# Patient Record
Sex: Female | Born: 1975 | Race: White | Hispanic: No | Marital: Married | Smoking: Former smoker
Health system: Southern US, Community
[De-identification: ages and names within clinical notes are randomized; demographics above are authoritative.]

## PROBLEM LIST (undated history)

## (undated) DIAGNOSIS — E785 Hyperlipidemia, unspecified: Secondary | ICD-10-CM

## (undated) DIAGNOSIS — I2089 Other forms of angina pectoris: Secondary | ICD-10-CM

## (undated) DIAGNOSIS — K219 Gastro-esophageal reflux disease without esophagitis: Secondary | ICD-10-CM

## (undated) DIAGNOSIS — I10 Essential (primary) hypertension: Secondary | ICD-10-CM

## (undated) DIAGNOSIS — F411 Generalized anxiety disorder: Secondary | ICD-10-CM

## (undated) DIAGNOSIS — I208 Other forms of angina pectoris: Secondary | ICD-10-CM

## (undated) HISTORY — DX: Other forms of angina pectoris: I20.89

## (undated) HISTORY — DX: Generalized anxiety disorder: F41.1

## (undated) HISTORY — DX: Hyperlipidemia, unspecified: E78.5

## (undated) HISTORY — PX: CHOLECYSTECTOMY: SHX55

## (undated) HISTORY — DX: Other forms of angina pectoris: I20.8

## (undated) HISTORY — DX: Essential (primary) hypertension: I10

## (undated) HISTORY — PX: HERNIA REPAIR: SHX51

## (undated) HISTORY — DX: Gastro-esophageal reflux disease without esophagitis: K21.9

---

## 2017-03-13 ENCOUNTER — Encounter: Payer: Self-pay | Admitting: *Deleted

## 2017-03-14 ENCOUNTER — Encounter (INDEPENDENT_AMBULATORY_CARE_PROVIDER_SITE_OTHER): Payer: Self-pay

## 2017-03-14 ENCOUNTER — Ambulatory Visit (INDEPENDENT_AMBULATORY_CARE_PROVIDER_SITE_OTHER): Admitting: Cardiovascular Disease

## 2017-03-14 ENCOUNTER — Telehealth: Payer: Self-pay | Admitting: Cardiovascular Disease

## 2017-03-14 ENCOUNTER — Encounter: Payer: Self-pay | Admitting: *Deleted

## 2017-03-14 ENCOUNTER — Encounter: Payer: Self-pay | Admitting: Cardiovascular Disease

## 2017-03-14 VITALS — BP 115/76 | HR 60 | Ht 59.5 in | Wt 161.0 lb

## 2017-03-14 DIAGNOSIS — M542 Cervicalgia: Secondary | ICD-10-CM | POA: Diagnosis not present

## 2017-03-14 DIAGNOSIS — M79602 Pain in left arm: Secondary | ICD-10-CM | POA: Diagnosis not present

## 2017-03-14 DIAGNOSIS — Z87891 Personal history of nicotine dependence: Secondary | ICD-10-CM | POA: Diagnosis not present

## 2017-03-14 DIAGNOSIS — R079 Chest pain, unspecified: Secondary | ICD-10-CM

## 2017-03-14 DIAGNOSIS — R002 Palpitations: Secondary | ICD-10-CM

## 2017-03-14 DIAGNOSIS — R5383 Other fatigue: Secondary | ICD-10-CM | POA: Diagnosis not present

## 2017-03-14 DIAGNOSIS — R0609 Other forms of dyspnea: Secondary | ICD-10-CM | POA: Diagnosis not present

## 2017-03-14 NOTE — Progress Notes (Signed)
CARDIOLOGY CONSULT NOTE  Patient ID: Donna Stewart MRN: [id number] DOB/AGE: [birth year] 39 y.o.  Admit date: (Not on file) Primary Physician: Toma Deiters, MD Referring Physician: Dr. Olena Leatherwood   Reason for Consultation: Chest pain  HPI: Donna Stewart is a 41 y.o. female who is being seen today for the evaluation of chest pain at the request of Hasanaj, Myra Gianotti, MD.   Past medical history includes tobacco use and GERD.  I personally reviewed all relevant documentation, labs, studies.   D-dimer was elevated at 0.9 on 02/21/17. She underwent a normal V/Q scan at [facility] on 02/21/17.  She will underwent a normal exercise treadmill stress test on 02/24/17 at [facility]. She exercised for 7 minutes and 40 seconds achieving a maximum heart rate of 160 bpm. Target heart rate was 154 bpm. There were reportedly no significant ST segment elevations or depressions.  She has felt fatigued for the past year. She said a sleep study approximately 2 years ago was normal. She has significant posterior neck pain exacerbated with rotation. The pain then radiates into the right side of her chest. She also has accompanying left shoulder and arm pain with tingling and numbness of her left hand. The chest pain is located behind the right breast.  She also describes intermittent palpitations which primarily occur when she is lying down at night watching TV in bed.  She has right-sided chest pains with deep breathing. Her mother has psoriatic arthritis. She said she was tested for polymyalgia rheumatica and the blood test was normal. I do not have any blood tests other than the d-dimer at present.  She said her neck pain is always present. She takes Aleve for this. The neck pain will radiate into the right chest and then she has left-sided jaw pain.  She quit smoking in January 2017 but smoked a pack a day for over 20 years.  She describes shortness of breath when climbing stairs in her  house. She has near syncopal episodes. She denies exertional chest pain.  Since January of this year she has lost 35 pounds with dieting.  Social history: She is married. She has 4 sons. She is originally from [place], [place]. She had been living in [place] and moved to [place] about 3/2 years ago. Her mother-in-law is also my patient.  Family history: Father developed heart disease in his late 40s/early 70s. He had an MI, CABG, and recently valve replacement surgery.   Allergies  Allergen Reactions  . Amoxicillin Hives  . Penicillins Hives  . Prednisone Other (See Comments) and Cough    Chest heaviness    Current Outpatient Prescriptions  Medication Sig Dispense Refill  . Cholecalciferol (VITAMIN D3) 1000 units CAPS Take 1 capsule by mouth daily.     Marland Kitchen esomeprazole (NEXIUM) 40 MG capsule Take 40 mg by mouth daily at 12 noon.    . metoprolol succinate (TOPROL-XL) 50 MG 24 hr tablet Take 25 mg by mouth daily. Take with or immediately following a meal.     . Multiple Vitamins-Minerals (HAIR SKIN NAILS PO) Take 3 tablets by mouth daily.    . nitroGLYCERIN (NITROSTAT) 0.4 MG SL tablet Place 0.4 mg under the tongue every 5 (five) minutes as needed for chest pain.    Marland Kitchen norethindrone (MICRONOR,CAMILA,ERRIN) 0.35 MG tablet Take 1 tablet by mouth daily.  3  . Omega-3 Fatty Acids (FISH OIL) 1000 MG CAPS Take 1,000-3,000 mg by mouth daily.     Marland Kitchen  sertraline (ZOLOFT) 25 MG tablet Take 25 mg by mouth daily.    Marland Kitchen topiramate (TOPAMAX) 50 MG tablet Take 50 mg by mouth every other day.     . vitamin C (ASCORBIC ACID) 500 MG tablet Take 500 mg by mouth daily.     No current facility-administered medications for this visit.     Past Medical History:  Diagnosis Date  . Dyslipidemia   . Gastroesophageal reflux disease   . Generalized anxiety disorder   . Hypertension   . Other forms of angina pectoris (HCC)     No past surgical history on file.  Social History   Social  History  . Marital status: Married    Spouse name: N/A  . Number of children: N/A  . Years of education: N/A   Occupational History  . Not on file.   Social History Main Topics  . Smoking status: Former Smoker    Packs/day: 0.50    Years: 24.00    Types: Cigarettes    Start date: 11/18/1991    Quit date: 06/04/2015  . Smokeless tobacco: Never Used  . Alcohol use Not on file  . Drug use: Unknown  . Sexual activity: Not on file   Other Topics Concern  . Not on file   Social History Narrative  . No narrative on file     No family history of premature CAD in 1st degree relatives.  Current Meds  Medication Sig  . Cholecalciferol (VITAMIN D3) 1000 units CAPS Take 1 capsule by mouth daily.   Marland Kitchen esomeprazole (NEXIUM) 40 MG capsule Take 40 mg by mouth daily at 12 noon.  . metoprolol succinate (TOPROL-XL) 50 MG 24 hr tablet Take 25 mg by mouth daily. Take with or immediately following a meal.   . Multiple Vitamins-Minerals (HAIR SKIN NAILS PO) Take 3 tablets by mouth daily.  . nitroGLYCERIN (NITROSTAT) 0.4 MG SL tablet Place 0.4 mg under the tongue every 5 (five) minutes as needed for chest pain.  Marland Kitchen norethindrone (MICRONOR,CAMILA,ERRIN) 0.35 MG tablet Take 1 tablet by mouth daily.  . Omega-3 Fatty Acids (FISH OIL) 1000 MG CAPS Take 1,000-3,000 mg by mouth daily.   . sertraline (ZOLOFT) 25 MG tablet Take 25 mg by mouth daily.  Marland Kitchen topiramate (TOPAMAX) 50 MG tablet Take 50 mg by mouth every other day.   . vitamin C (ASCORBIC ACID) 500 MG tablet Take 500 mg by mouth daily.      Review of systems complete and found to be negative unless listed above in HPI    Physical exam Blood pressure 115/76, pulse 60, height 4' 11.5" (1.511 m), weight 161 lb (73 kg). General: NAD Neck: No JVD, no thyromegaly or thyroid nodule. Posterior neck tenderness. Lungs: Clear to auscultation bilaterally with normal respiratory effort. CV: Nondisplaced PMI. Regular rate and rhythm, normal S1/S2, no S3/S4,  no murmur.  No peripheral edema.  No carotid bruit.    Abdomen: Soft, nontender, no distention.  Skin: Intact without lesions or rashes.  Neurologic: Alert and oriented x 3.  Psych: Normal affect. Extremities: No clubbing or cyanosis.  HEENT: Normal.   ECG: Most recent ECG reviewed.   Labs: No results found for: K, BUN, CREATININE, ALT, TSH, HGB   Lipids: No results found for: LDLCALC, LDLDIRECT, CHOL, TRIG, HDL      ASSESSMENT AND PLAN:  1. Chest pain: It appears to be more pleuritic in quality located on the right side. VQ scan was normal thus excluding pulmonary embolism. It is  not typical for coronary artery disease. She denies exertional symptoms. Stress test was also normal.  2. Posterior neck pain: It is exacerbated with neck rotation and she appears to have a left arm radiculopathy. She may have nerve root impingement. I will obtain an MRI of her cervical spine.  3. Palpitations: I will obtain a one-week event monitor to primarily evaluate for supraventricular arrhythmias.  4. Dyspnea on exertion: She has a history of tobacco abuse. Her mother and son have asthma. I will obtain spirometry to evaluate for obstructive and perhaps restrictive lung disease. I will also obtain copies of blood work from PCP.  5. Fatigue: This has been going on for over a year. Sleep study was reportedly normal. I wonder if she has the beginnings of autoimmune disease given her mother's history of psoriatic arthritis. The patient denies fevers, joint swelling, and rashes. I will obtain copies of blood tests from PCP. I would check an ANA if not already done yet.     Disposition: Follow up in 6 weeks.   Signed: Prentice Docker, M.D., F.A.C.C.  03/14/2017, 9:38 AM

## 2017-03-14 NOTE — Telephone Encounter (Signed)
MRI cervical spine w/&w/o contrast dx: neck and left arm pain Spirometry w/bronchodilation challenge dx: DOE & h/o tobacco abuse  Scheduled at [facility] Oct 18,2018 at 9:00 arrive at 8:45

## 2017-03-14 NOTE — Patient Instructions (Signed)
Medication Instructions:   Your physician recommends that you continue on your current medications as directed. Please refer to the Current Medication list given to you today.  Labwork:  Your physician recommends that you return for lab work to check a BMET prior to your MRI.  Testing/Procedures:  Your physician recommends that you have spirometry testing.  Your physician recommends that you have a MRI. Your physician has recommended that you wear an event monitor for 1 week. Event monitors are medical devices that record the heart's electrical activity. Doctors most often [place] these monitors to diagnose arrhythmias. Arrhythmias are problems with the speed or rhythm of the heartbeat. The monitor is a small, portable device. You can wear one while you do your normal daily activities. This is usually used to diagnose what is causing palpitations/syncope (passing out).   Follow-Up: Your physician recommends that you schedule a follow-up appointment in: 6 weeks.  Any Other Special Instructions Will Be Listed Below (If Applicable).  If you need a refill on your cardiac medications before your next appointment, please call your pharmacy.

## 2017-03-17 ENCOUNTER — Telehealth: Payer: Self-pay | Admitting: Cardiovascular Disease

## 2017-03-17 NOTE — Telephone Encounter (Signed)
Left message for patient to call to schedule   Spirometry w/bronchodilation challenge dx: DOE & h/o tobacco abuse

## 2017-03-18 NOTE — Telephone Encounter (Signed)
Scheduled Spirometry at [facility] for Mar 26, 2017 Arrive at 9:15 instructions given

## 2017-03-19 ENCOUNTER — Ambulatory Visit (INDEPENDENT_AMBULATORY_CARE_PROVIDER_SITE_OTHER)

## 2017-03-19 DIAGNOSIS — R002 Palpitations: Secondary | ICD-10-CM | POA: Diagnosis not present

## 2017-03-20 ENCOUNTER — Ambulatory Visit (HOSPITAL_COMMUNITY)
Admission: RE | Admit: 2017-03-20 | Discharge: 2017-03-20 | Disposition: A | Discharge status: Home / Self Care | Source: Ambulatory Visit | Attending: Cardiovascular Disease | Admitting: Cardiovascular Disease

## 2017-03-20 ENCOUNTER — Telehealth: Payer: Self-pay | Admitting: *Deleted

## 2017-03-20 ENCOUNTER — Encounter (HOSPITAL_COMMUNITY): Payer: Self-pay | Admitting: Radiology

## 2017-03-20 DIAGNOSIS — M542 Cervicalgia: Secondary | ICD-10-CM | POA: Diagnosis present

## 2017-03-20 DIAGNOSIS — M79602 Pain in left arm: Secondary | ICD-10-CM | POA: Insufficient documentation

## 2017-03-20 NOTE — Telephone Encounter (Signed)
MR CERVICAL SPINE WO CONTRAST   Notes recorded by Laqueta LindenKoneswaran, Suresh A, MD on 03/20/2017 at 10:20 AM EDT Normal.   Basic metabolic panel   Notes recorded by Laqueta LindenKoneswaran, Suresh A, MD on 03/19/2017 at 3:43 PM EDT Normal Kidney function, normal potassium.

## 2017-03-20 NOTE — Telephone Encounter (Signed)
Notes recorded by Lesle ChrisHill, Lemon Whitacre G, LPN on 81/19/147810/18/2018 at 11:57 AM EDT Patient notified. Copy to pmd. Follow up scheduled 04/29/2017.

## 2017-03-26 ENCOUNTER — Ambulatory Visit (HOSPITAL_COMMUNITY)
Admission: RE | Admit: 2017-03-26 | Discharge: 2017-03-26 | Disposition: A | Discharge status: Home / Self Care | Source: Ambulatory Visit | Attending: Cardiovascular Disease | Admitting: Cardiovascular Disease

## 2017-03-26 DIAGNOSIS — Z87891 Personal history of nicotine dependence: Secondary | ICD-10-CM | POA: Diagnosis not present

## 2017-03-26 DIAGNOSIS — R0609 Other forms of dyspnea: Secondary | ICD-10-CM | POA: Diagnosis present

## 2017-03-26 LAB — SPIROMETRY WITH GRAPH
FEF 25-75 PRE: 2.64 L/s
FEF 25-75 Post: 2.4 L/sec
FEF2575-%CHANGE-POST: -9 %
FEF2575-%Pred-Post: 82 %
FEF2575-%Pred-Pre: 91 %
FEV1-%CHANGE-POST: -3 %
FEV1-%Pred-Post: 96 %
FEV1-%Pred-Pre: 100 %
FEV1-PRE: 2.62 L
FEV1-Post: 2.52 L
FEV1FVC-%Change-Post: -5 %
FEV1FVC-%Pred-Pre: 100 %
FEV6-%Change-Post: 2 %
FEV6-%PRED-POST: 104 %
FEV6-%Pred-Pre: 102 %
FEV6-PRE: 3.19 L
FEV6-Post: 3.26 L
FEV6FVC-%Pred-Post: 102 %
FEV6FVC-%Pred-Pre: 102 %
FVC-%CHANGE-POST: 2 %
FVC-%PRED-POST: 102 %
FVC-%Pred-Pre: 100 %
FVC-POST: 3.26 L
FVC-Pre: 3.19 L
POST FEV1/FVC RATIO: 77 %
PRE FEV6/FVC RATIO: 100 %
Post FEV6/FVC ratio: 100 %
Pre FEV1/FVC ratio: 82 %

## 2017-03-26 MED ORDER — ALBUTEROL SULFATE (2.5 MG/3ML) 0.083% IN NEBU
2.5000 mg | INHALATION_SOLUTION | Freq: Once | RESPIRATORY_TRACT | Status: AC
  Administered 2017-03-26: 2.5 mg via RESPIRATORY_TRACT

## 2017-03-27 ENCOUNTER — Telehealth: Payer: Self-pay | Admitting: *Deleted

## 2017-03-27 NOTE — Telephone Encounter (Signed)
Notes recorded by Lesle ChrisHill, Chaela Branscum G, LPN on 16/10/960410/25/2018 at 2:38 PM EDT Patient notified. Copy to pmd. Follow up scheduled 04/29/2017 with Dr. Purvis SheffieldKoneswaran. ------  Notes recorded by Laqueta LindenKoneswaran, Suresh A, MD on 03/26/2017 at 10:56 AM EDT Normal.

## 2017-04-11 ENCOUNTER — Encounter: Payer: Self-pay | Admitting: *Deleted

## 2017-04-29 ENCOUNTER — Ambulatory Visit: Admitting: Cardiovascular Disease

## 2017-05-28 ENCOUNTER — Encounter: Payer: Self-pay | Admitting: Cardiovascular Disease

## 2017-05-28 ENCOUNTER — Encounter: Payer: Self-pay | Admitting: *Deleted

## 2017-05-28 ENCOUNTER — Ambulatory Visit (INDEPENDENT_AMBULATORY_CARE_PROVIDER_SITE_OTHER): Admitting: Cardiovascular Disease

## 2017-05-28 VITALS — BP 104/70 | HR 60 | Ht 59.5 in | Wt 163.0 lb

## 2017-05-28 DIAGNOSIS — M542 Cervicalgia: Secondary | ICD-10-CM | POA: Diagnosis not present

## 2017-05-28 DIAGNOSIS — Z87891 Personal history of nicotine dependence: Secondary | ICD-10-CM | POA: Diagnosis not present

## 2017-05-28 DIAGNOSIS — R079 Chest pain, unspecified: Secondary | ICD-10-CM

## 2017-05-28 DIAGNOSIS — R5383 Other fatigue: Secondary | ICD-10-CM | POA: Diagnosis not present

## 2017-05-28 DIAGNOSIS — R0609 Other forms of dyspnea: Secondary | ICD-10-CM

## 2017-05-28 DIAGNOSIS — R002 Palpitations: Secondary | ICD-10-CM

## 2017-05-28 DIAGNOSIS — M79602 Pain in left arm: Secondary | ICD-10-CM | POA: Diagnosis not present

## 2017-05-28 NOTE — Progress Notes (Signed)
SUBJECTIVE: The patient presents for follow-up after undergoing studies for cervical spine pain and radiculopathy, palpitations, and shortness of breath.  MRI of the cervical spine was normal.  Event monitoring demonstrated sinus rhythm with rare PVCs.  Spirometry was normal.  She continues to feel fatigued.  She reportedly underwent a normal sleep study 2 years ago.  She continues to have posterior neck pain and right-sided chest pain.  She gets dizzy after climbing a flight of stairs.  Upon further discussion, she had at least 2 tick bites over the summer.  She said the takes were not engorged.    Social history: She is married. She has 4 sons. She is originally from [place], [place]. She had been living in [place] and moved to [place] about 3 [place] half years ago. Her mother-in-law is also my patient.  Review of Systems: As per "subjective", otherwise negative.  Allergies  Allergen Reactions  . Amoxicillin Hives  . Penicillins Hives  . Prednisone Other (See Comments) and Cough    Chest heaviness    Current Outpatient Medications  Medication Sig Dispense Refill  . Cholecalciferol (VITAMIN D3) 1000 units CAPS Take 1 capsule by mouth daily.     . Collagen 500 MG CAPS Take 1 capsule by mouth daily.    Marland Kitchen. esomeprazole (NEXIUM) 40 MG capsule Take 40 mg by mouth daily at 12 noon.    . metoprolol succinate (TOPROL-XL) 50 MG 24 hr tablet Take 25 mg by mouth daily. Take with or immediately following a meal.     . Multiple Vitamins-Minerals (HAIR SKIN NAILS PO) Take 3 tablets by mouth daily.    . nitroGLYCERIN (NITROSTAT) 0.4 MG SL tablet Place 0.4 mg under the tongue every 5 (five) minutes as needed for chest pain.    Marland Kitchen. norethindrone (MICRONOR,CAMILA,ERRIN) 0.35 MG tablet Take 1 tablet by mouth daily.  3  . Omega-3 Fatty Acids (FISH OIL) 1000 MG CAPS Take 1,000-3,000 mg by mouth daily.     . sertraline (ZOLOFT) 25 MG tablet Take 25 mg by mouth daily.    Marland Kitchen.  topiramate (TOPAMAX) 50 MG tablet Take 50 mg by mouth 2 (two) times daily.      No current facility-administered medications for this visit.     Past Medical History:  Diagnosis Date  . Dyslipidemia   . Gastroesophageal reflux disease   . Generalized anxiety disorder   . Hypertension   . Other forms of angina pectoris [facility](HCC)     Past Surgical History:  Procedure Laterality Date  . CESAREAN SECTION     X's 3  . CHOLECYSTECTOMY    . HERNIA REPAIR      Social History   Socioeconomic History  . Marital status: Married    Spouse name: Not on file  . Number of children: Not on file  . Years of education: Not on file  . Highest education level: Not on file  Social Needs  . Financial resource strain: Not on file  . Food insecurity - worry: Not on file  . Food insecurity - inability: Not on file  . Transportation needs - medical: Not on file  . Transportation needs - non-medical: Not on file  Occupational History  . Not on file  Tobacco Use  . Smoking status: Former Smoker    Packs/day: 0.50    Years: 24.00    Pack years: 12.00    Types: Cigarettes    Start date: 11/18/1991  Last attempt to quit: 06/04/2015    Years since quitting: 1.9  . Smokeless tobacco: Never Used  Substance and Sexual Activity  . Alcohol use: Not on file  . Drug use: Not on file  . Sexual activity: Not on file  Other Topics Concern  . Not on file  Social History Narrative  . Not on file     Vitals:   05/28/17 1130  BP: 104/70  Pulse: 60  Weight: 163 lb (73.9 kg)  Height: 4' 11.5" (1.511 m)    Wt Readings from Last 3 Encounters:  05/28/17 163 lb (73.9 kg)  03/14/17 161 lb (73 kg)     PHYSICAL EXAM General: NAD HEENT: Normal. Neck: No JVD, no thyromegaly. Lungs: Clear to auscultation bilaterally with normal respiratory effort. CV: Regular rate and rhythm, normal S1/S2, no S3/S4, no murmur. No pretibial or periankle edema. Abdomen: Soft, nontender, no distention.  Neurologic:  Alert and oriented.  Psych: Normal affect. Skin: Normal. Musculoskeletal: No gross deformities.    ECG: Most recent ECG reviewed.   Labs: No results found for: K, BUN, CREATININE, ALT, TSH, HGB   Lipids: No results found for: LDLCALC, LDLDIRECT, CHOL, TRIG, HDL     ASSESSMENT AND PLAN: 1. Chest pain: It appears to be more pleuritic in quality located on the right side.  There may be a musculoskeletal component as well.  VQ scan was normal thus excluding pulmonary embolism. It is not typical for coronary artery disease. She denies exertional symptoms. Stress test was also normal.  2. Posterior neck pain: It is exacerbated with neck rotation and she appears to have a left arm radiculopathy. She may have nerve root impingement. MRI of her cervical spine was normal.  3. Palpitations: Event monitoring was normal with rare PVCs seen.  4. Dyspnea on exertion: She has a history of tobacco abuse. Her mother and son have asthma.  Spirometry was normal.  5. Fatigue: This has been going on for over a year. Sleep study was reportedly normal. I wonder if she has the beginnings of autoimmune disease given her mother's history of psoriatic arthritis. The patient denies fevers, joint swelling, and rashes. I would check an ANA if not already done yet.  She was bitten by at least 2 tics over the summer and may have developed a post tick bite syndrome.      Disposition: Follow up as needed   Prentice DockerSuresh Koneswaran, M.D., F.A.C.C.

## 2017-05-28 NOTE — Patient Instructions (Signed)
Medication Instructions:  Continue all current medications.  Labwork: none  Testing/Procedures: none  Follow-Up: As needed.    Any Other Special Instructions Will Be Listed Below (If Applicable).  If you need a refill on your cardiac medications before your next appointment, please call your pharmacy.  

## 2018-07-15 ENCOUNTER — Ambulatory Visit (INDEPENDENT_AMBULATORY_CARE_PROVIDER_SITE_OTHER): Admitting: "Endocrinology

## 2018-07-15 ENCOUNTER — Encounter: Payer: Self-pay | Admitting: "Endocrinology

## 2018-07-15 VITALS — BP 125/87 | HR 60 | Ht 59.0 in | Wt 185.0 lb

## 2018-07-15 DIAGNOSIS — Z8632 Personal history of gestational diabetes: Secondary | ICD-10-CM

## 2018-07-15 DIAGNOSIS — Z6837 Body mass index (BMI) 37.0-37.9, adult: Secondary | ICD-10-CM

## 2018-07-15 DIAGNOSIS — I10 Essential (primary) hypertension: Secondary | ICD-10-CM

## 2018-07-15 DIAGNOSIS — E66812 Obesity, class 2: Secondary | ICD-10-CM | POA: Insufficient documentation

## 2018-07-15 NOTE — Progress Notes (Signed)
Endocrinology Consult Note                                            07/15/2018, 4:31 PM   Subjective:    Patient ID: Donna Stewart, female    DOB: 11/26/1975, PCP Donna Stabile, MD   Past Medical History:  Diagnosis Date  . Dyslipidemia   . Gastroesophageal reflux disease   . Generalized anxiety disorder   . Hypertension   . Other forms of angina pectoris [facility])    Past Surgical History:  Procedure Laterality Date  . CESAREAN SECTION     X's 3  . CHOLECYSTECTOMY    . HERNIA REPAIR     Social History   Socioeconomic History  . Marital status: Married    Spouse name: Not on file  . Number of children: Not on file  . Years of education: Not on file  . Highest education level: Not on file  Occupational History  . Not on file  Social Needs  . Financial resource strain: Not on file  . Food insecurity:    Worry: Not on file    Inability: Not on file  . Transportation needs:    Medical: Not on file    Non-medical: Not on file  Tobacco Use  . Smoking status: Former Smoker    Packs/day: 0.50    Years: 24.00    Pack years: 12.00    Types: Cigarettes    Start date: 11/18/1991    Last attempt to quit: 06/04/2015    Years since quitting: 3.1  . Smokeless tobacco: Never Used  Substance and Sexual Activity  . Alcohol use: Not Currently  . Drug use: Never  . Sexual activity: Not on file  Lifestyle  . Physical activity:    Days per week: Not on file    Minutes per session: Not on file  . Stress: Not on file  Relationships  . Social connections:    Talks on phone: Not on file    Gets together: Not on file    Attends religious service: Not on file    Active member of club or organization: Not on file    Attends meetings of clubs or organizations: Not on file    Relationship status: Not on file  Other Topics Concern  . Not on file  Social History Narrative  . Not on file   Outpatient Encounter Medications as of 07/15/2018  Medication Sig  . aspirin EC 81  MG tablet Take 81 mg by mouth daily.  Marland Kitchen BIOTIN PO Take by mouth daily.  . Biotin w/ Vitamins C & E (HAIR/SKIN/NAILS PO) Take by mouth daily.  Marland Kitchen omeprazole (PRILOSEC) 40 MG capsule Take 40 mg by mouth daily.  . vitamin B-12 (CYANOCOBALAMIN) 500 MCG tablet Take 500 mcg by mouth daily.  . [DISCONTINUED] norethindrone (MICRONOR,CAMILA,ERRIN) 0.35 MG tablet Take by mouth daily.  . metoprolol succinate (TOPROL-XL) 50 MG 24 hr tablet Take 25 mg by mouth daily. Take with or immediately following a meal.   . norethindrone (MICRONOR,CAMILA,ERRIN) 0.35 MG tablet Take 1 tablet by mouth daily.  . Omega-3 Fatty Acids (FISH OIL) 1000 MG CAPS Take 1,000 mg by mouth 3 (three) times daily before meals.  . sertraline (ZOLOFT) 25 MG tablet Take 25 mg by mouth daily.  . [DISCONTINUED] Cholecalciferol (VITAMIN D3) 1000 units CAPS  Take 1 capsule by mouth daily.   . [DISCONTINUED] Collagen 500 MG CAPS Take 1 capsule by mouth daily.  . [DISCONTINUED] esomeprazole (NEXIUM) 40 MG capsule Take 40 mg by mouth daily at 12 noon.  . [DISCONTINUED] Multiple Vitamins-Minerals (HAIR SKIN NAILS PO) Take 3 tablets by mouth daily.  . [DISCONTINUED] nitroGLYCERIN (NITROSTAT) 0.4 MG SL tablet Place 0.4 mg under the tongue every 5 (five) minutes as needed for chest pain.  . [DISCONTINUED] topiramate (TOPAMAX) 50 MG tablet Take 50 mg by mouth 2 (two) times daily.    No facility-administered encounter medications on file as of 07/15/2018.    ALLERGIES: Allergies  Allergen Reactions  . Amoxicillin Hives  . Penicillins Hives  . Prednisone Other (See Comments) and Cough    Chest heaviness    VACCINATION STATUS:  There is no immunization history on file for this patient.  HPI Donna Stewart is 43 y.o. female who presents today with a medical history as above. she is being seen in consultation for weight management requested by Donna StabileHall, Donna Z, MD.  -She has history of gestational diabetes with her last pregnancy at age 43.    she  has been dealing with  Progressive weight gain, fatigue. She was never diagnosed with diabetes, thyroid dysfunction. She is taking Biotin for hair loss. She has family hx of thyroid dysfunction including her mother and  A grandparent.   Review of Systems  Constitutional: + gained 20 lbs since last year, + fatigue, no subjective hyperthermia, no subjective hypothermia Eyes: no blurry vision, no xerophthalmia ENT: no sore throat, no nodules palpated in throat, no dysphagia/odynophagia, no hoarseness Cardiovascular: no Chest Pain, no Shortness of Breath, no palpitations, no leg swelling Respiratory: no cough, no shortness of breath Gastrointestinal: no Nausea/Vomiting/Diarhhea Musculoskeletal: no muscle/joint aches Skin: no rashes Neurological: no tremors, no numbness, no tingling, no dizziness Psychiatric: no depression, no anxiety  Objective:    BP 125/87   Pulse 60   Ht 4\' 11"  (1.499 m)   Wt 185 lb (83.9 kg)   BMI 37.37 kg/m   Wt Readings from Last 3 Encounters:  07/15/18 185 lb (83.9 kg)  05/28/17 163 lb (73.9 kg)  03/14/17 161 lb (73 kg)    Physical Exam  Constitutional:  +obese for height, not in acute distress, normal state of mind Eyes: PERRLA, EOMI, no exophthalmos ENT: moist mucous membranes, no gross thyromegaly, no gross cervical lymphadenopathy Cardiovascular: normal precordial activity, Regular Rate and Rhythm, no Murmur/Rubs/Gallops Respiratory:  adequate breathing efforts, no gross chest deformity, Clear to auscultation bilaterally Gastrointestinal: abdomen soft, Non -tender, No distension, Bowel Sounds present, no gross organomegaly Musculoskeletal: no gross deformities, strength intact in all four extremities Skin: moist, warm, no rashes Neurological: no tremor with outstretched hands, Deep tendon reflexes normal in bilateral lower extremities.   No recent labs to review.       Assessment & Plan:   1. Class 2 severe obesity due to excess calories with  serious comorbidity and body mass index (BMI) of 37.0 to 37.9 in adult [facility](HCC) 2. Hx of gestational diabetes mellitus, not currently pregnant  - Donna Stewart  is being seen at a kind request of Margo AyeHall, Kathleene HazelJohn Z, MD. - She does not have recent  endocrine  Records to review and I have clinically evaluated the patient. - Based on these reviews, she has high risk for type 2 DM, she has family hx of thyroid dysfunction. - I will start by obtaining basic work up with a1c, TFTs,  Vit D. -I had a long discussion with her about weight and carbs consumptions.  She will benefit from weight loss. - Patient admits there is a room for improvement in her diet and drink choices. -  Suggestion is made for her to avoid simple carbohydrates  from her diet including Cakes, Sweet Desserts / Pastries, Ice Cream, Soda (diet and regular), Sweet Tea, Candies, Chips, Cookies, Store Bought Juices, Alcohol in Excess of  1-2 drinks a day, Artificial Sweeteners, and "Sugar-free" Products. This will help patient to have stable blood glucose profile and potentially avoid unintended weight gain.  -she is advised to bring in her medications , including Biotin, for review. - I did not initiate any new prescriptions today. - I advised her  to maintain close follow up with Donna StabileHall, Donna Z, MD for primary care needs.   - Time spent with the patient: 35 minutes, of which >50% was spent in obtaining information about her symptoms, reviewing her previous labs/studies,  evaluations, and treatments, counseling her about her  Obesity, hx of GDM , and developing a plan to confirm the diagnosis and long term treatment based on the latest standards of care/guidelines.    Donna Stewart participated in the discussions, expressed understanding, and voiced agreement with the above plans.  All questions were answered to her satisfaction. she is encouraged to contact clinic should she have any questions or concerns prior to her return visit.  Follow up  plan: Return in about 10 days (around 07/25/2018) for Labs Today- Non-Fasting Ok.   Marquis LunchGebre Bralee Feldt, MD [facility] [address] [place], [place] [address] Phone: [phone]  Fax: [phone]     07/15/2018, 4:31 PM  This note was partially dictated with voice recognition software. Similar sounding words can be transcribed inadequately or may not  be corrected upon review.

## 2018-07-15 NOTE — Patient Instructions (Signed)

## 2018-07-16 LAB — COMPLETE METABOLIC PANEL WITH GFR
AG RATIO: 1.8 (calc) (ref 1.0–2.5)
ALT: 27 U/L (ref 6–29)
AST: 20 U/L (ref 10–30)
Albumin: 4.3 g/dL (ref 3.6–5.1)
Alkaline phosphatase (APISO): 52 U/L (ref 31–125)
BUN: 11 mg/dL (ref 7–25)
CALCIUM: 9.4 mg/dL (ref 8.6–10.2)
CO2: 26 mmol/L (ref 20–32)
Chloride: 105 mmol/L (ref 98–110)
Creat: 0.74 mg/dL (ref 0.50–1.10)
GFR, EST AFRICAN AMERICAN: 117 mL/min/{1.73_m2} (ref >=60)
GFR, EST NON AFRICAN AMERICAN: 101 mL/min/{1.73_m2} (ref >=60)
GLUCOSE: 97 mg/dL (ref 65–99)
Globulin: 2.4 g/dL (calc) (ref 1.9–3.7)
Potassium: 4.3 mmol/L (ref 3.5–5.3)
Sodium: 139 mmol/L (ref 135–146)
TOTAL PROTEIN: 6.7 g/dL (ref 6.1–8.1)
Total Bilirubin: 0.3 mg/dL (ref 0.2–1.2)

## 2018-07-16 LAB — LIPID PANEL
Cholesterol: 192 mg/dL (ref <200)
HDL: 60 mg/dL (ref >=50)
LDL CHOLESTEROL (CALC): 114 mg/dL — AB
NON-HDL CHOLESTEROL (CALC): 132 mg/dL — AB (ref <130)
TRIGLYCERIDES: 82 mg/dL (ref <150)
Total CHOL/HDL Ratio: 3.2 (calc) (ref <5.0)

## 2018-07-16 LAB — CBC WITH DIFFERENTIAL/PLATELET
Absolute Monocytes: 504 cells/uL (ref 200–950)
Basophils Absolute: 62 cells/uL (ref 0–200)
Basophils Relative: 0.9 %
EOS PCT: 4.7 %
Eosinophils Absolute: 324 cells/uL (ref 15–500)
HCT: 34.7 % — ABNORMAL LOW (ref 35.0–45.0)
Hemoglobin: 11.5 g/dL — ABNORMAL LOW (ref 11.7–15.5)
Lymphs Abs: 2167 cells/uL (ref 850–3900)
MCH: 25.5 pg — ABNORMAL LOW (ref 27.0–33.0)
MCHC: 33.1 g/dL (ref 32.0–36.0)
MCV: 76.9 fL — AB (ref 80.0–100.0)
MPV: 10.4 fL (ref 7.5–12.5)
Monocytes Relative: 7.3 %
NEUTROS PCT: 55.7 %
Neutro Abs: 3843 cells/uL (ref 1500–7800)
PLATELETS: 382 10*3/uL (ref 140–400)
RBC: 4.51 10*6/uL (ref 3.80–5.10)
RDW: 14.1 % (ref 11.0–15.0)
TOTAL LYMPHOCYTE: 31.4 %
WBC: 6.9 10*3/uL (ref 3.8–10.8)

## 2018-07-16 LAB — T4, FREE: Free T4: 1.1 ng/dL (ref 0.8–1.8)

## 2018-07-16 LAB — HEMOGLOBIN A1C
EAG (MMOL/L): 6.6 (calc)
HEMOGLOBIN A1C: 5.8 %{Hb} — AB (ref <5.7)
MEAN PLASMA GLUCOSE: 120 (calc)

## 2018-07-16 LAB — VITAMIN D 25 HYDROXY (VIT D DEFICIENCY, FRACTURES): VIT D 25 HYDROXY: 25 ng/mL — AB (ref 30–100)

## 2018-07-16 LAB — TSH: TSH: 2.04 mIU/L

## 2018-07-29 ENCOUNTER — Ambulatory Visit: Admitting: "Endocrinology

## 2018-09-17 IMAGING — MR MR CERVICAL SPINE W/O CM
4 of 5 series · 15 of 48 positions shown · non-contrast
Comparison: None.

CLINICAL DATA: Neck pain radiating to the left shoulder for 2
months.

EXAM:
MRI CERVICAL SPINE WITHOUT CONTRAST
TECHNIQUE: Multiplanar, multisequence MR imaging of the cervical spine was
performed. No intravenous contrast was administered.

[Series 3: T2 · sagittal · 3.0mm · 0.44mm/px · 6 of 13 slices shown (1 of 2)]
[im 1/13]
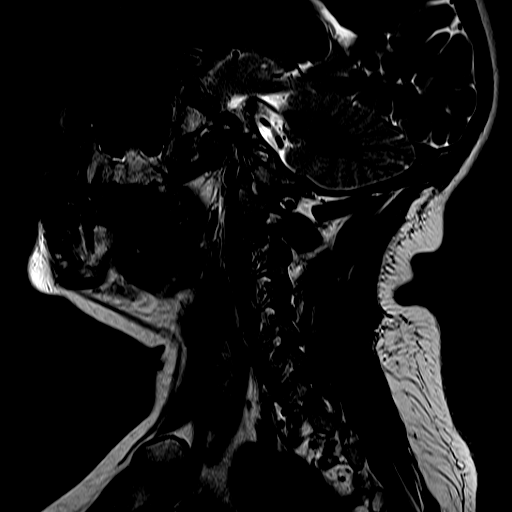
[im 3/13]
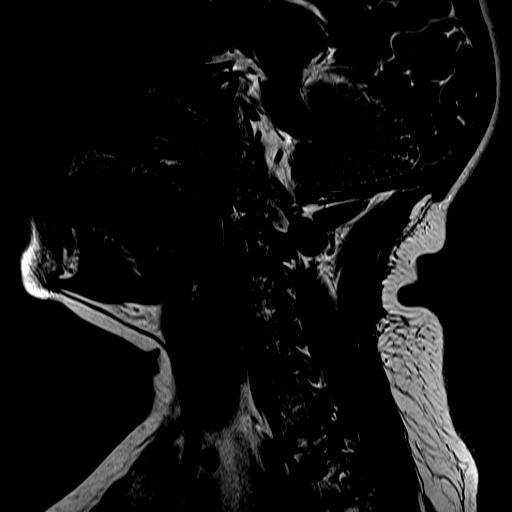
[im 5/13]
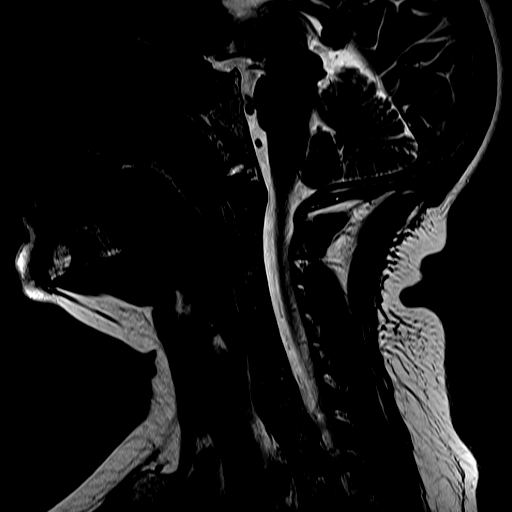
[im 8/13]
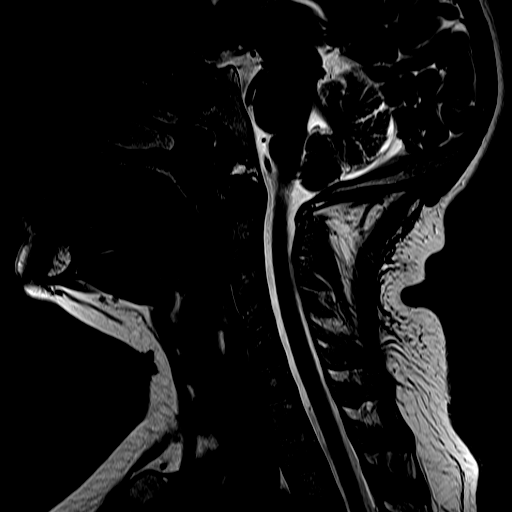
[im 10/13]
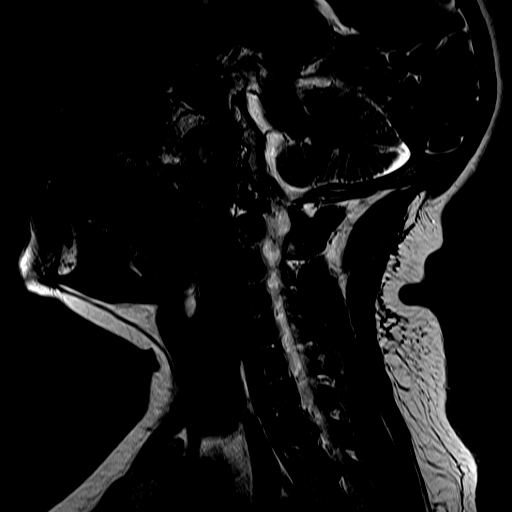
[im 13/13]
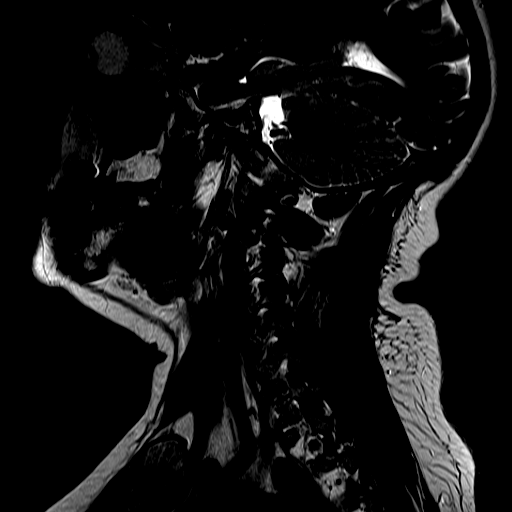

[Series 4: FLAIR · sagittal · 3.0mm · 0.46mm/px · 3 of 13 slices shown]
[im 3/13]
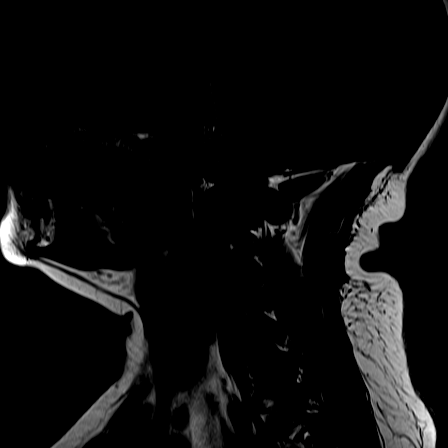
[im 8/13]
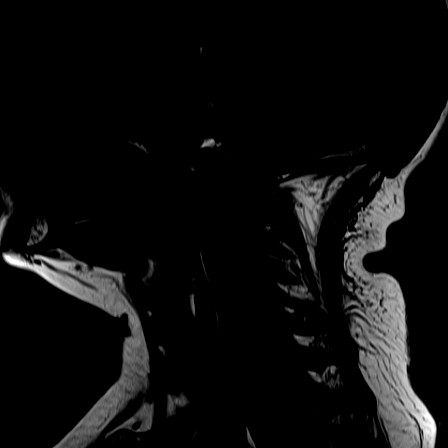
[im 13/13]
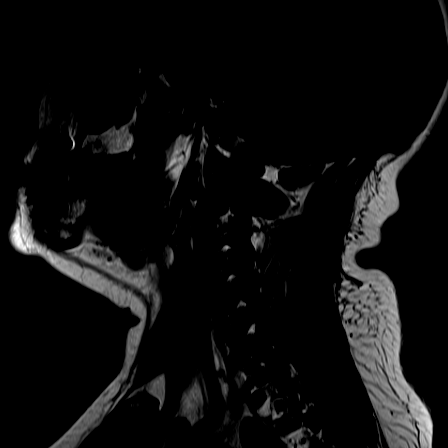

[Series 5: ir sagital · sagittal · 3.0mm · 0.25mm/px · 3 of 13 slices shown]
[im 3/13]
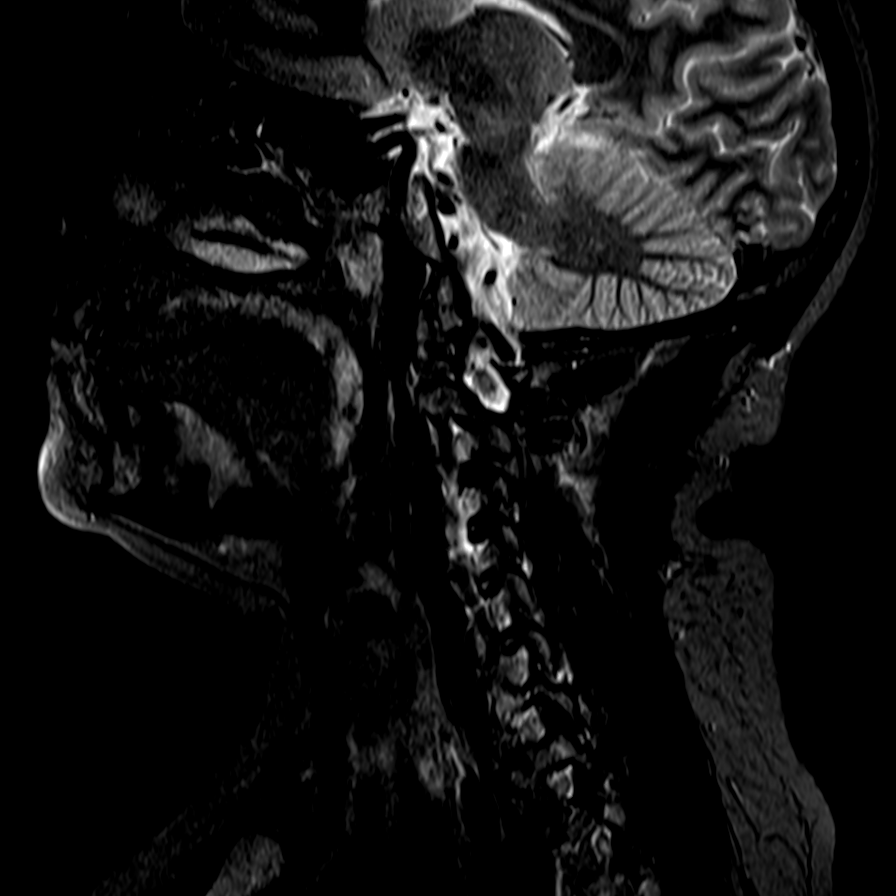
[im 8/13]
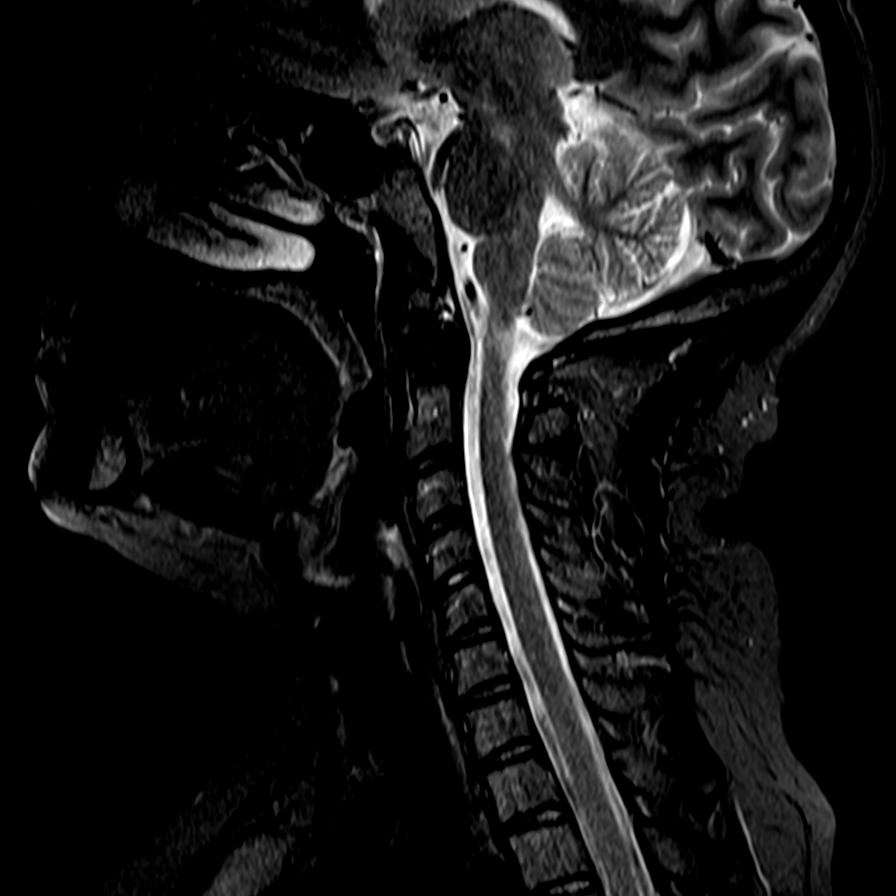
[im 13/13]
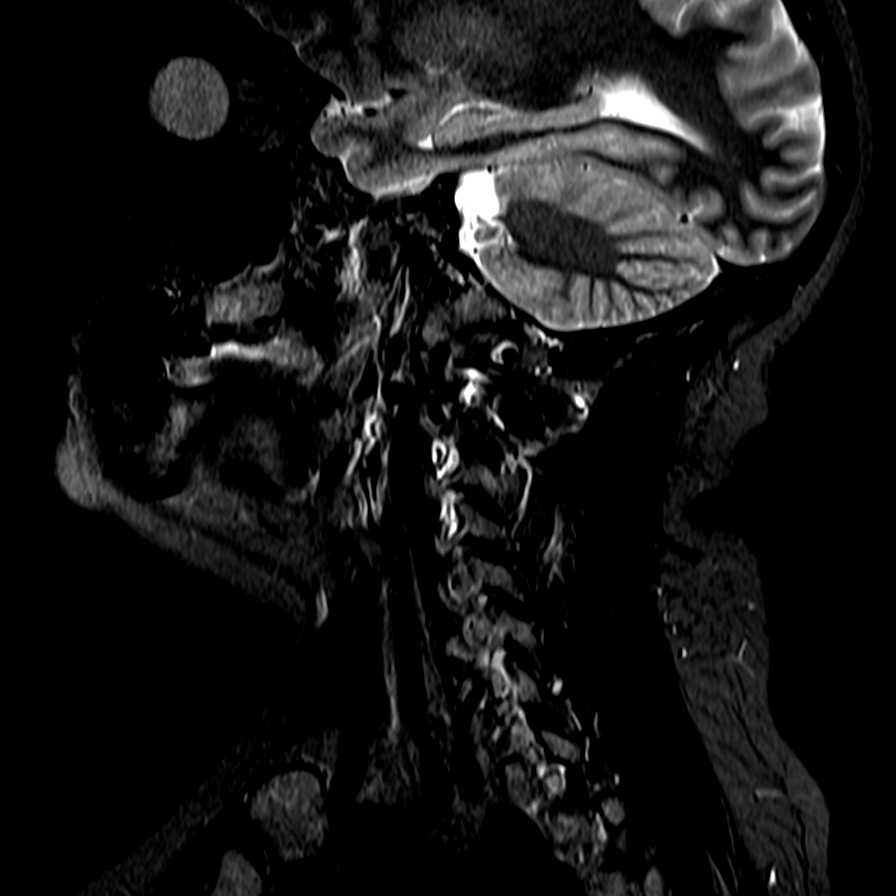

[Series 7: T2 · axial · 3.0mm · 0.18mm/px · z∈[-75,-7]mm · 3 of 32 slices shown (2 of 2)]
[im 5/32]
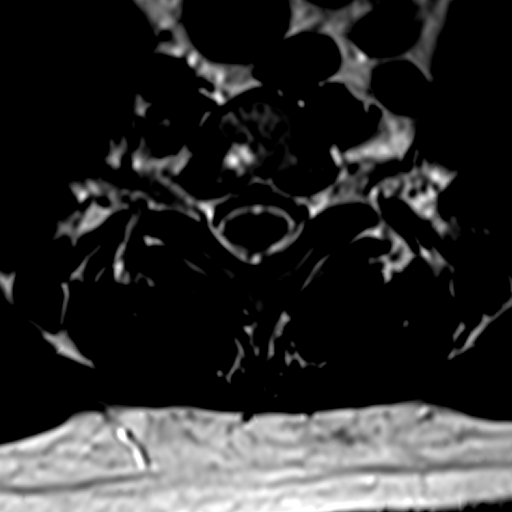
[im 16/32]
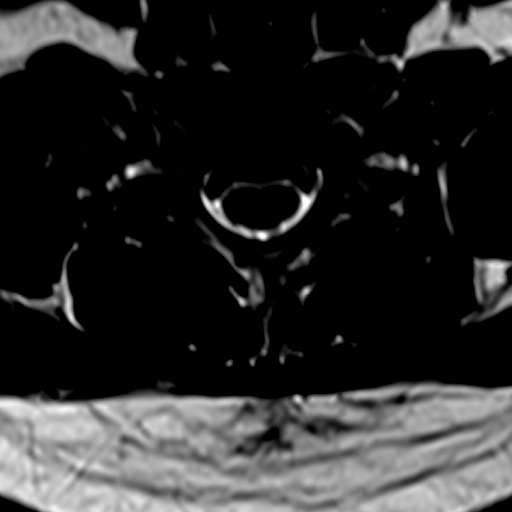
[im 27/32]
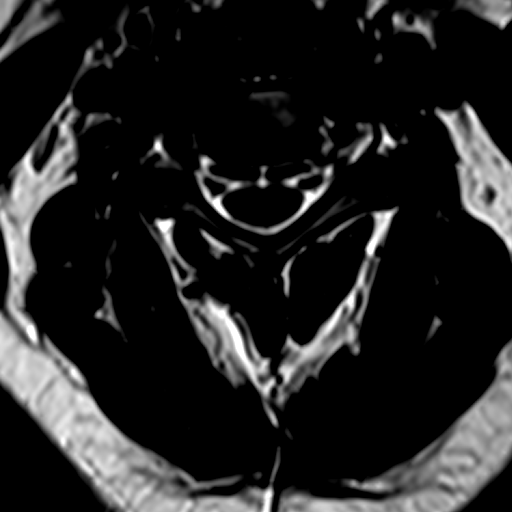

[15 of 48 positions shown; findings below may reference images not displayed]

FINDINGS: Alignment: Normal.

Vertebrae: Incidental T1 vertebral body hemangioma. No suspicious
osseous lesion, fracture, or marrow edema.

Cord: Normal signal and morphology.

Posterior Fossa, vertebral arteries, paraspinal tissues:
Unremarkable.

Disc levels:

Preserved disc space heights throughout the cervical spine without
evidence of disc herniation or other significant degenerative
change. Widely patent spinal canal and neural foramina.
IMPRESSION: Negative cervical spine MRI.

## 2020-06-06 ENCOUNTER — Other Ambulatory Visit (HOSPITAL_COMMUNITY): Payer: Self-pay | Admitting: Internal Medicine

## 2020-06-06 ENCOUNTER — Other Ambulatory Visit: Payer: Self-pay

## 2020-06-06 ENCOUNTER — Ambulatory Visit (HOSPITAL_COMMUNITY)
Admission: RE | Admit: 2020-06-06 | Discharge: 2020-06-06 | Disposition: A | Discharge status: Home / Self Care | Source: Ambulatory Visit | Attending: Internal Medicine | Admitting: Internal Medicine

## 2020-06-06 DIAGNOSIS — R051 Acute cough: Secondary | ICD-10-CM

## 2020-06-06 DIAGNOSIS — J45909 Unspecified asthma, uncomplicated: Secondary | ICD-10-CM
